# Patient Record
Sex: Male | Born: 2000 | Race: Black or African American | Hispanic: No | Marital: Single | Smoking: Never smoker
Health system: Southern US, Community
[De-identification: ages and names within clinical notes are randomized; demographics above are authoritative.]

## PROBLEM LIST (undated history)

## (undated) DIAGNOSIS — R569 Unspecified convulsions: Secondary | ICD-10-CM

---
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
## 2020-04-18 ENCOUNTER — Emergency Department (HOSPITAL_COMMUNITY): Payer: Medicaid Other

## 2020-04-18 ENCOUNTER — Emergency Department (HOSPITAL_COMMUNITY)
Admission: EM | Admit: 2020-04-18 | Discharge: 2020-04-18 | Disposition: A | Discharge status: Home / Self Care | Payer: Medicaid Other | Attending: Emergency Medicine | Admitting: Emergency Medicine

## 2020-04-18 ENCOUNTER — Other Ambulatory Visit: Payer: Self-pay

## 2020-04-18 ENCOUNTER — Encounter (HOSPITAL_COMMUNITY): Payer: Self-pay | Admitting: *Deleted

## 2020-04-18 DIAGNOSIS — M5442 Lumbago with sciatica, left side: Secondary | ICD-10-CM | POA: Diagnosis not present

## 2020-04-18 DIAGNOSIS — M545 Low back pain: Secondary | ICD-10-CM | POA: Diagnosis not present

## 2020-04-18 DIAGNOSIS — Z79899 Other long term (current) drug therapy: Secondary | ICD-10-CM | POA: Insufficient documentation

## 2020-04-18 DIAGNOSIS — M5137 Other intervertebral disc degeneration, lumbosacral region: Secondary | ICD-10-CM | POA: Insufficient documentation

## 2020-04-18 DIAGNOSIS — M5432 Sciatica, left side: Secondary | ICD-10-CM | POA: Insufficient documentation

## 2020-04-18 DIAGNOSIS — M5136 Other intervertebral disc degeneration, lumbar region: Secondary | ICD-10-CM | POA: Diagnosis not present

## 2020-04-18 MED ORDER — IBUPROFEN 600 MG PO TABS: 600.0000 mg | ORAL_TABLET | Freq: Four times a day (QID) | ORAL | 0 refills | Status: DC | PRN

## 2020-04-18 MED ORDER — METHOCARBAMOL 500 MG PO TABS
500.0000 mg | ORAL_TABLET | Freq: Two times a day (BID) | ORAL | 0 refills | Status: DC
Start: 2020-04-18 — End: 2022-04-22

## 2020-04-18 NOTE — Discharge Instructions (Addendum)
Take your medicines as prescribed.  Avoid lifting,  Bending,  Twisting or any other activity that worsens your pain over the next week.  Apply a heating pad to your lower back for 20 minutes several times daily.  You should get rechecked if your symptoms are not better over the next 5 days,  Or you develop increased pain,  Weakness in your leg(s) or loss of bladder or bowel function - these can be symptoms of a worsened injury.  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  This can be a chronic condition that causes occasional pain, but if you develop persistent pain or symptoms of weakness as mentioned, you will need to see a back specialist.  Discuss this with your primary MD if needed.

## 2020-04-18 NOTE — ED Triage Notes (Signed)
Pain in low back radiating into left leg, injured a year ago playing basketball

## 2020-04-18 NOTE — ED Provider Notes (Signed)
[facility] EMERGENCY DEPARTMENT Provider Note   CSN: [id number] Arrival date & time: 04/18/20  1258     History Chief Complaint  Patient presents with  . Back Pain    Cicero R Nelles is a 19 y.o. male with a history as outlined below, presenting for low back pain with radiation into his left posterior thigh which started yesterday.  He reports having an injury to his back about a year ago as he landed from reaching a rebound while playing basketball with his back twisted.  Since then he has had several episodes of low back pain, but presents today with worsened pain with radiation into his mid left posterior thigh.  This flareup started yesterday.  Pain is worsened with movement and positional changes and is better at rest.  He has had no medications or other treatment prior to arrival.  He denies weakness or numbness in his legs and has had no fevers, denies urinary or fecal incontinence or retention.  Denies IV drug use.  HPI     Past Medical History:  Diagnosis Date  . Seizures [facility])     Patient Active Problem List   Diagnosis Date Noted  . 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 Syncope 09/11/2014  . Convulsion (HCC) 08/20/2014    History reviewed. No pertinent surgical history.     No family history on file.  Social History   Tobacco Use  . Smoking status: Never Smoker  . Smokeless tobacco: Never Used  Substance Use Topics  . Alcohol use: No    Alcohol/week: 0.0 standard drinks  . Drug use: No    Home Medications Prior to Admission medications   Medication Sig Start Date End Date Taking? Authorizing Provider  cetirizine (ZYRTEC) 10 MG tablet Take 1 tablet (10 mg total) by mouth daily. 04/16/17   Rosiland Oz, MD  fluticasone (FLONASE) 50 MCG/ACT nasal spray Place 2 sprays into both nostrils daily. 04/16/17   Rosiland Oz, MD  ibuprofen (ADVIL) 600 MG tablet Take 1 tablet (600 mg total) by mouth every 6 (six) hours as needed.  04/18/20   Burgess Amor, PA-C  methocarbamol (ROBAXIN) 500 MG tablet Take 1 tablet (500 mg total) by mouth 2 (two) times daily. 04/18/20   Burgess Amor, PA-C    Allergies    Patient has no known allergies.  Review of Systems   Review of Systems  Constitutional: Negative for fever.  Respiratory: Negative for shortness of breath.   Cardiovascular: Negative for chest pain and leg swelling.  Gastrointestinal: Negative for abdominal distention, abdominal pain and constipation.  Genitourinary: Negative for difficulty urinating, dysuria, flank pain, frequency and urgency.  Musculoskeletal: Positive for back pain. Negative for gait problem and joint swelling.  Skin: Negative for rash.  Neurological: Negative for weakness and numbness.    Physical Exam Updated Vital Signs BP 112/68   Pulse 68   Temp 98 F (36.7 C)   Resp 18   Ht 5\' 8"  (1.727 m)   Wt 57.6 kg   SpO2 100%   BMI 19.31 kg/m   Physical Exam Vitals and nursing note reviewed.  Constitutional:      Appearance: He is well-developed.  HENT:     Head: Normocephalic.  Eyes:     Conjunctiva/sclera: Conjunctivae normal.  Cardiovascular:     Rate and Rhythm: Normal rate.     Comments: Pedal pulses normal. Pulmonary:     Effort: Pulmonary effort is normal.  Abdominal:     General:  Bowel sounds are normal. There is no distension.     Palpations: Abdomen is soft. There is no mass.  Musculoskeletal:        General: Normal range of motion.     Cervical back: Normal range of motion and neck supple.     Lumbar back: Tenderness present. No swelling, edema or spasms.  Skin:    General: Skin is warm and dry.  Neurological:     Mental Status: He is alert.     Sensory: No sensory deficit.     Motor: No tremor or atrophy.     Gait: Gait normal.     Deep Tendon Reflexes:     Reflex Scores:      Patellar reflexes are 2+ on the right side and 2+ on the left side.      Achilles reflexes are 2+ on the right side and 2+ on the left  side.    Comments: No strength deficit noted in hip and knee flexor and extensor muscle groups.  Ankle flexion and extension intact.     ED Results / Procedures / Treatments   Labs (all labs ordered are listed, but only abnormal results are displayed) Labs Reviewed - No data to display  EKG None  Radiology DG Lumbar Spine Complete  Result Date: 04/18/2020 CLINICAL DATA:  Low back pain with radicular symptoms EXAM: LUMBAR SPINE - COMPLETE 4+ VIEW COMPARISON:  None. FINDINGS: Frontal, lateral, spot lumbosacral lateral, and bilateral oblique views were obtained. There are 5 non-rib-bearing lumbar type vertebral bodies. There is no fracture or spondylolisthesis. There is mild disc space narrowing at L5-S1. Other disc spaces appear normal. There is no appreciable facet arthropathy. IMPRESSION: Mild disc space narrowing at L5-S1. Other discs appear normal. Appreciable facet arthropathy. No fracture or spondylolisthesis. Electronically Signed   By: Bretta Bang III M.D.   On: 04/18/2020 14:50    Procedures Procedures (including critical care time)  Medications Ordered in ED Medications - No data to display  ED Course  I have reviewed the triage vital signs and the nursing notes.  Pertinent labs & imaging results that were available during my care of the patient were reviewed by me and considered in my medical decision making (see chart for details).    MDM Rules/Calculators/A&P                          No neuro deficit on exam or by history to suggest emergent or surgical presentation.  Also discussed worsened sx that should prompt immediate re-evaluation including distal weakness, bowel/bladder retention/incontinence.   Plan f/u with pcp in one week if not improved.  Also discussed he may need back specialist for any persistent or worsened sx which he can discuss with pcp.  Ibuprofen, robaxin, heat tx. No neuro deficits. No equina sx.        Final Clinical Impression(s) / ED  Diagnoses Final diagnoses:  Sciatica of left side  DDD (degenerative disc disease), lumbosacral    Rx / DC Orders ED Discharge Orders         Ordered    ibuprofen (ADVIL) 600 MG tablet  Every 6 hours PRN     Discontinue  Reprint     04/18/20 1502    methocarbamol (ROBAXIN) 500 MG tablet  2 times daily     Discontinue  Reprint     04/18/20 1502           Burgess Amor, PA-C 04/18/20  1547    Terrilee Files, MD 04/18/20 [phone]

## 2020-12-04 IMAGING — DX DG LUMBAR SPINE COMPLETE 4+V
5 series · 5 of 5 positions shown · non-contrast
Comparison: None.

CLINICAL DATA: Low back pain with radicular symptoms

EXAM:
LUMBAR SPINE - COMPLETE 4+ VIEW

[l-spine ap]
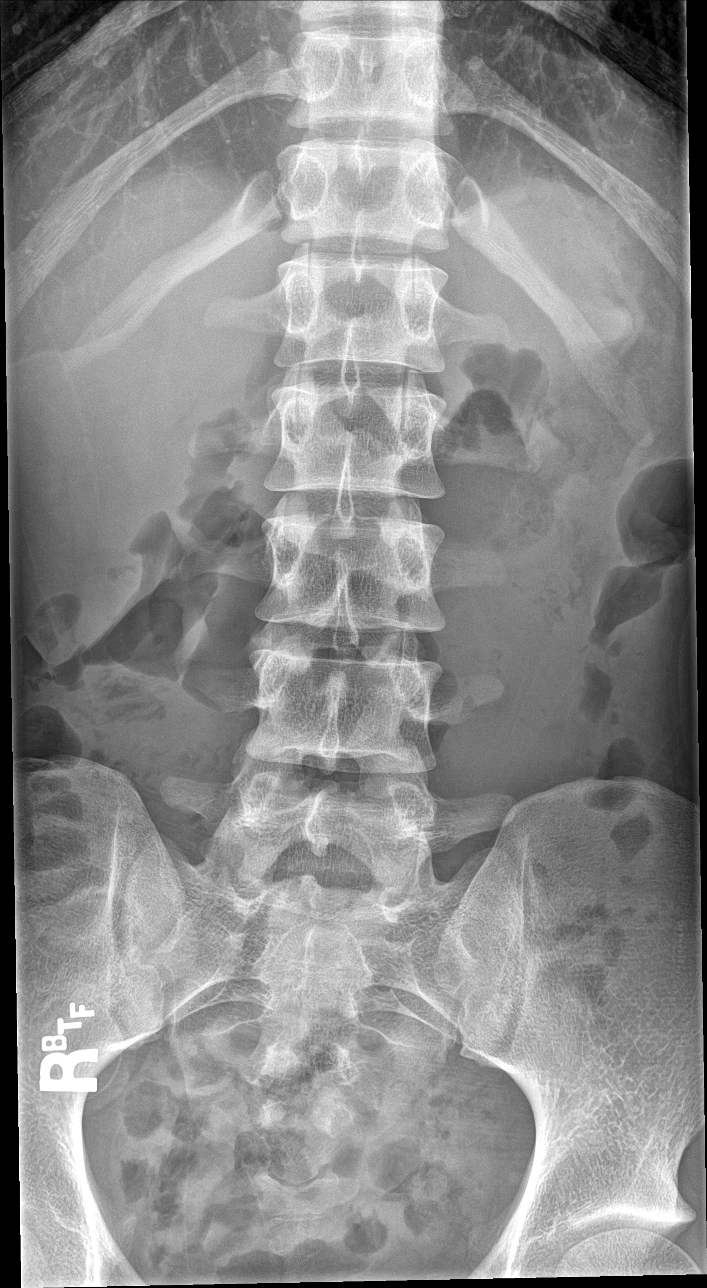

[l-spine obl (1 of 2)]
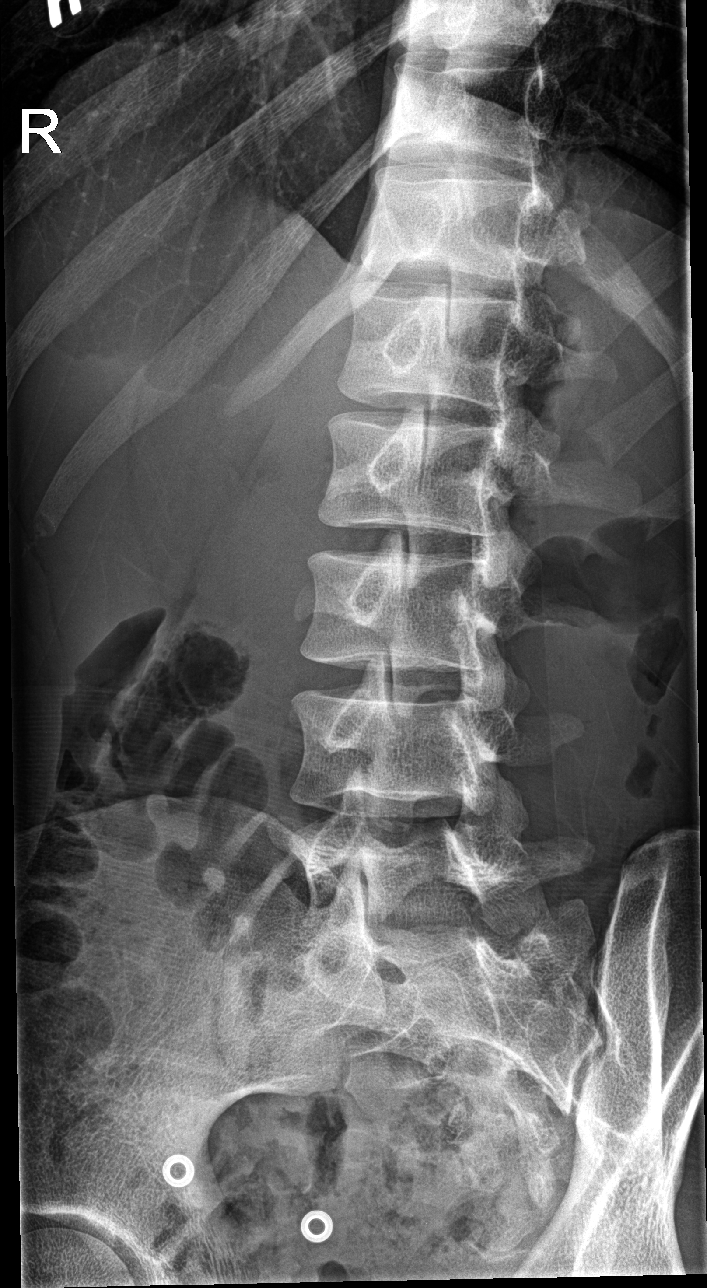

[l-spine obl (2 of 2)]
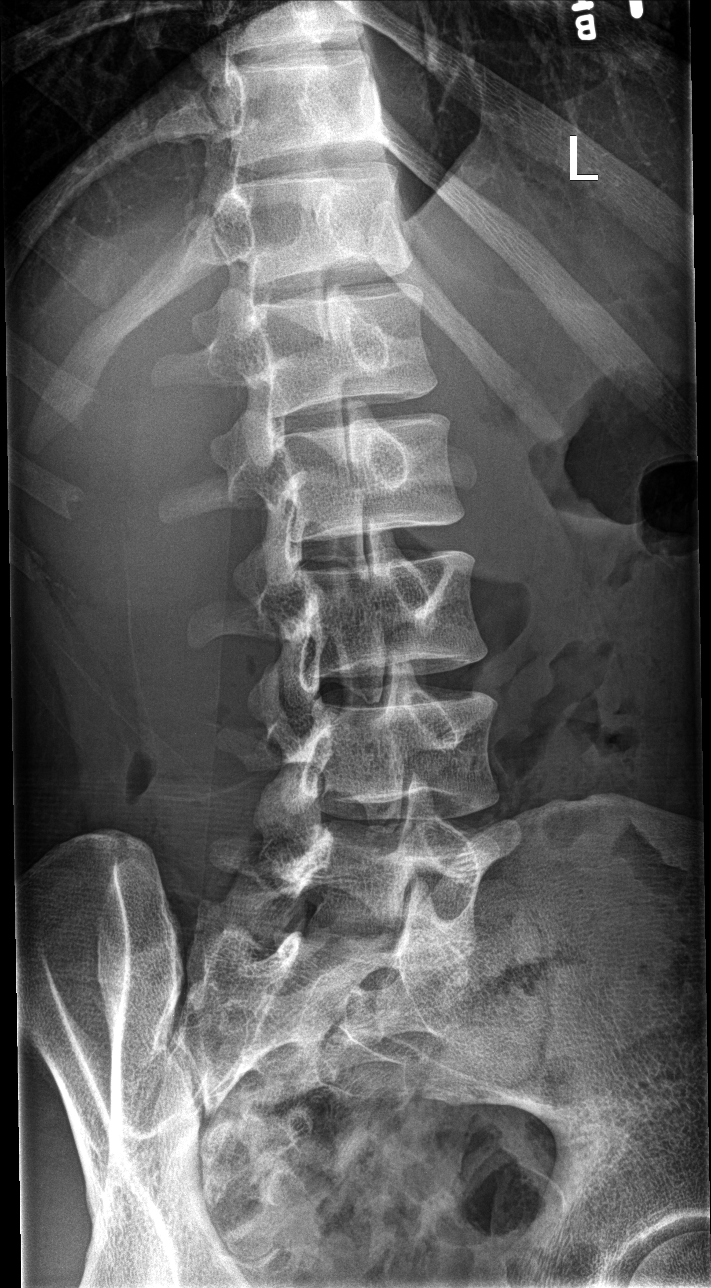

[l-spine lat]
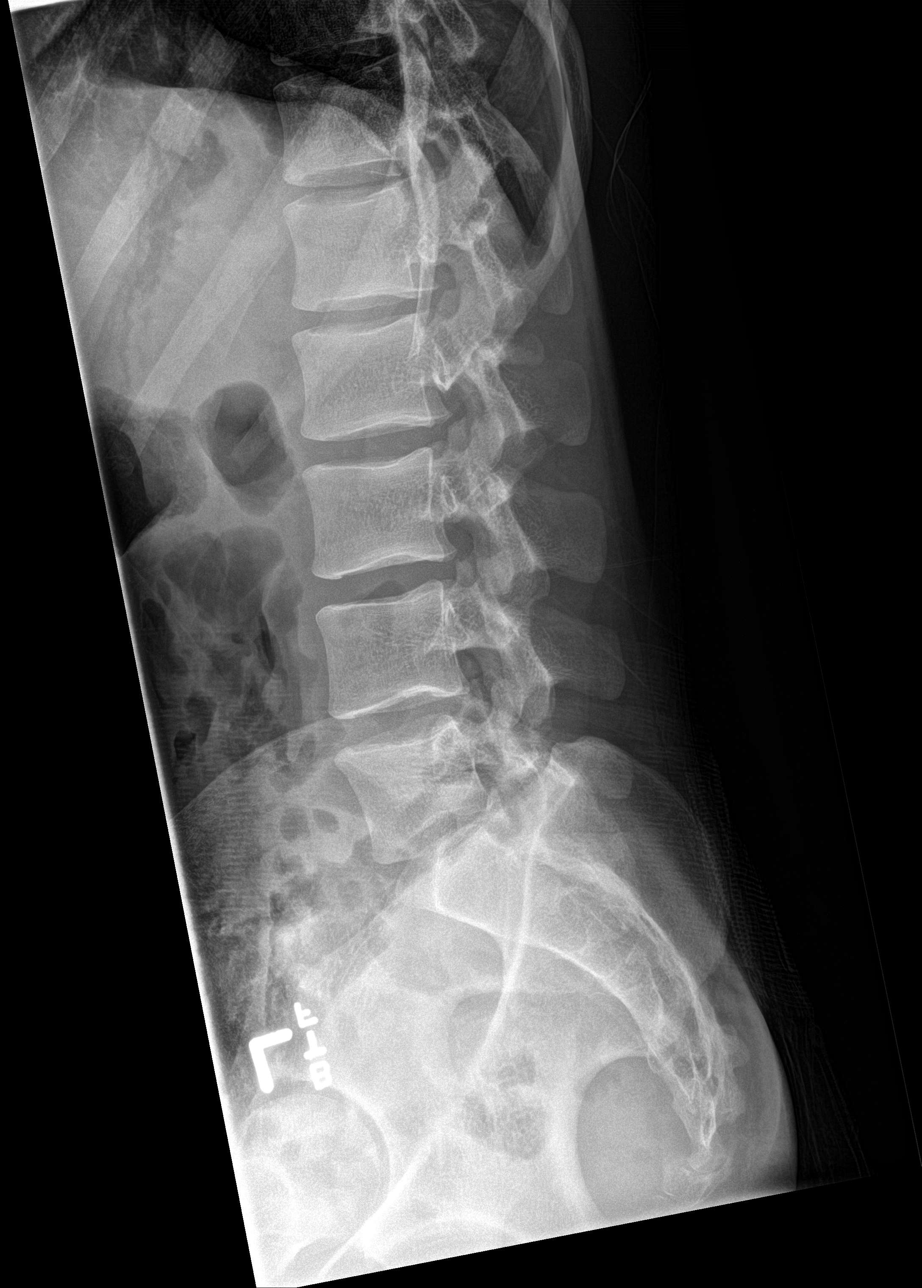

[l-spine spot]
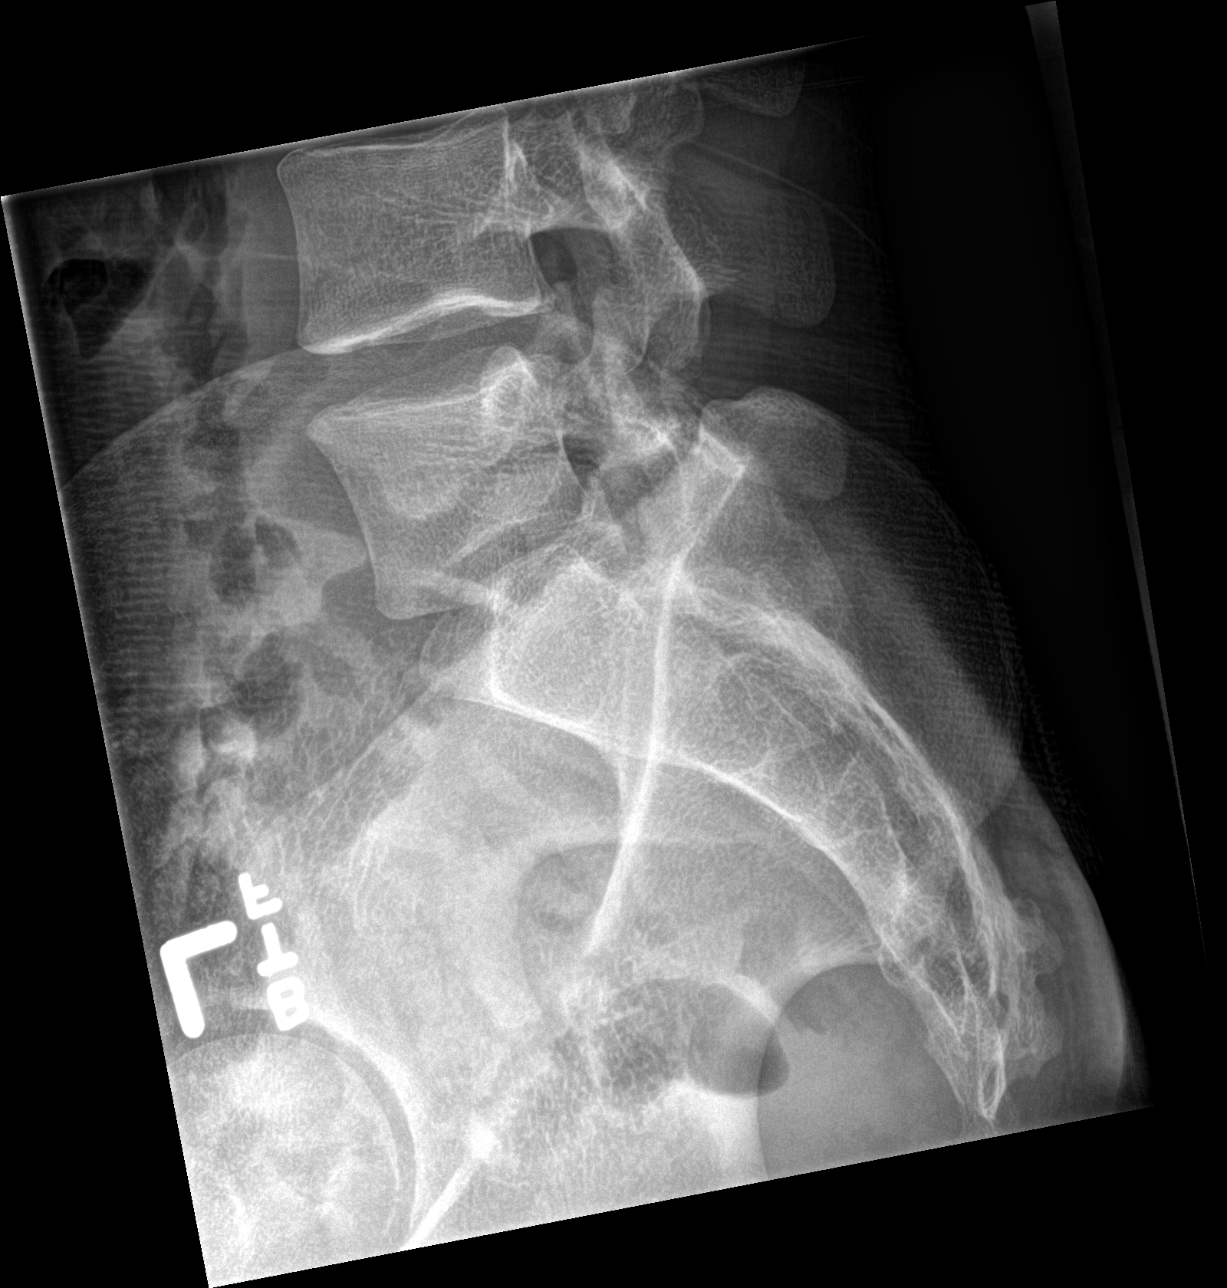

[5 of 5 positions shown; findings below may reference images not displayed]

FINDINGS: Frontal, lateral, spot lumbosacral lateral, and bilateral oblique
views were obtained. There are 5 non-rib-bearing lumbar type
vertebral bodies. There is no fracture or spondylolisthesis. There
is mild disc space narrowing at L5-S1. Other disc spaces appear
normal. There is no appreciable facet arthropathy.
IMPRESSION: Mild disc space narrowing at L5-S1. Other discs appear normal.
Appreciable facet arthropathy. No fracture or spondylolisthesis.

## 2022-04-20 ENCOUNTER — Ambulatory Visit
Admission: EM | Admit: 2022-04-20 | Discharge: 2022-04-20 | Disposition: A | Discharge status: Home / Self Care | Payer: Medicaid Other | Attending: Nurse Practitioner | Admitting: Nurse Practitioner

## 2022-04-20 ENCOUNTER — Ambulatory Visit (INDEPENDENT_AMBULATORY_CARE_PROVIDER_SITE_OTHER): Payer: Medicaid Other

## 2022-04-20 DIAGNOSIS — S92252A Displaced fracture of navicular [scaphoid] of left foot, initial encounter for closed fracture: Secondary | ICD-10-CM | POA: Diagnosis not present

## 2022-04-20 DIAGNOSIS — M79672 Pain in left foot: Secondary | ICD-10-CM

## 2022-04-20 MED ORDER — IBUPROFEN 800 MG PO TABS: 800.0000 mg | ORAL_TABLET | Freq: Three times a day (TID) | ORAL | 0 refills | Status: DC

## 2022-04-20 NOTE — Discharge Instructions (Addendum)
Your x-rays show suspicion of a small fracture of the bone in your left foot. Take medication as prescribed. RICE therapy, rest, ice, compression and elevation. Nonweightbearing until you have been seen by orthopedics. You will need to follow-up with [facility] [place] at [phone] or [facility] in [place] at [phone].  He should follow-up with orthopedics within the next 48 to 72 hours. Follow-up as needed.

## 2022-04-20 NOTE — ED Triage Notes (Signed)
Pt presents with left foot pain from fall last night playing basketball

## 2022-04-20 NOTE — ED Provider Notes (Signed)
[facility]    CSN: [id number] Arrival date & time: 04/20/22  0935      History   Chief Complaint Chief Complaint  Patient presents with   Foot Injury    HPI Tanner Carter is a 21 y.o. male.    Foot Injury   Patient presents for complaints of left foot pain after landing incorrectly playing basketball last evening.  Pain is located to the "outside" of the left foot.  Patient states after the injury, he developed pain, swelling, and inability to bear weight.  He denies numbness, tingling, or radiation of pain.  States that he applied ice to the left foot after the injury.  Denies any previous injury or trauma to the left foot.  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 No pertinent surgical history.     Home Medications    Prior to Admission medications   Medication Sig Start Date End Date Taking? Authorizing Provider  ibuprofen (ADVIL) 800 MG tablet Take 1 tablet (800 mg total) by mouth 3 (three) times daily. 04/20/22  Yes Gagandeep Pettet-Warren, Sadie Haber, NP  cetirizine (ZYRTEC) 10 MG tablet Take 1 tablet (10 mg total) by mouth daily. 04/16/17   Rosiland Oz, MD  fluticasone (FLONASE) 50 MCG/ACT nasal spray Place 2 sprays into both nostrils daily. 04/16/17   Rosiland Oz, MD  methocarbamol (ROBAXIN) 500 MG tablet Take 1 tablet (500 mg total) by mouth 2 (two) times daily. 04/18/20   Burgess Amor, PA-C    Family History History reviewed. No pertinent family history.  Social History Social History   Tobacco Use   Smoking status: Never   Smokeless tobacco: Never  Substance Use Topics   Alcohol use: No    Alcohol/week: 0.0 standard drinks of alcohol   Drug use: No     Allergies   Patient has no known allergies.   Review of Systems Review of  Systems Per HPI  Physical Exam Triage Vital Signs ED Triage Vitals  Enc Vitals Group     BP 04/20/22 0956 117/79     Pulse Rate 04/20/22 0956 90     Resp 04/20/22 0956 18     Temp 04/20/22 0956 98.6 F (37 C)     Temp src --      SpO2 04/20/22 0956 100 %     Weight --      Height --      Head Circumference --      Peak Flow --      Pain Score 04/20/22 0955 7     Pain Loc --      Pain Edu? --      Excl. in GC? --    No data found.  Updated Vital Signs BP 117/79   Pulse 90   Temp 98.6 F (37 C)   Resp 18   SpO2 100%   Visual Acuity Right Eye Distance:   Left Eye Distance:   Bilateral Distance:    Right Eye Near:   Left Eye Near:    Bilateral Near:     Physical Exam Vitals and nursing note reviewed.  Constitutional:      Appearance: Normal appearance.  HENT:     Head: Normocephalic.  Eyes:     Extraocular Movements: Extraocular movements  intact.     Pupils: Pupils are equal, round, and reactive to light.  Pulmonary:     Effort: Pulmonary effort is normal.  Musculoskeletal:     Cervical back: Normal range of motion.     Left foot: Decreased range of motion. Normal capillary refill. Swelling and tenderness present. Normal pulse.  Neurological:     General: No focal deficit present.     Mental Status: He is alert and oriented to person, place, and time.  Psychiatric:        Mood and Affect: Mood normal.        Behavior: Behavior normal.      UC Treatments / Results  Labs (all labs ordered are listed, but only abnormal results are displayed) Labs Reviewed - No data to display  EKG   Radiology DG Foot Complete Left  Result Date: 04/20/2022 CLINICAL DATA:  Lateral foot pain after rolling ankle yesterday while playing basketball. EXAM: LEFT FOOT - COMPLETE 3+ VIEW COMPARISON:  None Available. FINDINGS: There is dorsal soft tissue swelling overlying the talus and navicular bone. A small avulsion fracture is suspected arising off the dorsal aspect of  the navicular bone adjacent to the talonavicular joint. No additional acute osseous findings. IMPRESSION: Suspect small avulsion fracture arising off the dorsal aspect of the navicular bone adjacent to the talonavicular joint. Recommend correlation for any focal tenderness over this area. Electronically Signed   By: Signa Kell M.D.   On: 04/20/2022 10:10    Procedures Procedures (including critical care time)  Medications Ordered in UC Medications - No data to display  Initial Impression / Assessment and Plan / UC Course  I have reviewed the triage vital signs and the nursing notes.  Pertinent labs & imaging results that were available during my care of the patient were reviewed by me and considered in my medical decision making (see chart for details).  Patient presents with left foot pain after playing basketball 1 day ago.  On exam, patient has tenderness to the fifth metatarsal.  X-rays show suspicion for small avulsion fracture of the navicular bone of the left foot.  Short leg posterior and stirrup splint were applied.  Patient was given prescription for ibuprofen for pain management.  Supportive care recommendations were provided to the patient.  Patient was given information to follow-up with [facility] [place] or [facility] in [place] within the next 48 to 72 hours.  Follow-up as needed.  Final Clinical Impressions(s) / UC Diagnoses   Final diagnoses:  Closed avulsion fracture of navicular bone of left foot, initial encounter     Discharge Instructions      Your x-rays show suspicion of a small fracture of the bone in your left foot. Take medication as prescribed. RICE therapy, rest, ice, compression and elevation. Nonweightbearing until you have been seen by orthopedics. You will need to follow-up with [facility] [place] at [phone] or [facility] in [place] at [phone].  He should follow-up with orthopedics within the next 48 to 72 hours. Follow-up  as needed.       ED Prescriptions     Medication Sig Dispense Auth. Provider   ibuprofen (ADVIL) 800 MG tablet Take 1 tablet (800 mg total) by mouth 3 (three) times daily. 30 tablet Vineet Kinney-Warren, Sadie Haber, NP      PDMP not reviewed this encounter.   Abran Cantor, NP 04/20/22 1031

## 2022-04-22 ENCOUNTER — Encounter: Payer: Self-pay | Admitting: Orthopaedic Surgery

## 2022-04-22 ENCOUNTER — Ambulatory Visit (INDEPENDENT_AMBULATORY_CARE_PROVIDER_SITE_OTHER): Payer: Medicaid Other | Admitting: Orthopaedic Surgery

## 2022-04-22 VITALS — BP 116/78 | HR 62

## 2022-04-22 DIAGNOSIS — S92255A Nondisplaced fracture of navicular [scaphoid] of left foot, initial encounter for closed fracture: Secondary | ICD-10-CM | POA: Diagnosis not present

## 2022-04-22 DIAGNOSIS — S92355A Nondisplaced fracture of fifth metatarsal bone, left foot, initial encounter for closed fracture: Secondary | ICD-10-CM | POA: Diagnosis not present

## 2022-04-22 MED ORDER — HYDROCODONE-ACETAMINOPHEN 5-325 MG PO TABS: ORAL_TABLET | ORAL | 0 refills | Status: AC

## 2022-04-22 NOTE — Patient Instructions (Signed)
OOW NOTE Do contrast baths Wear boot when up on foot

## 2022-04-22 NOTE — Progress Notes (Signed)
Subjective:    Patient ID: Tanner Carter, male    DOB: [birth year], 21 y.o.   MRN: [id number]  HPI He hurt his left foot playing basketball with his brother on 04-19-22.  He came down from layup and twisted his left foot.  He has pain dorsally and laterally.  He was seen at the Urgent Care on 04-20-22.  X-rays were done and showed: IMPRESSION: Suspect small avulsion fracture arising off the dorsal aspect of the navicular bone adjacent to the talonavicular joint. Recommend correlation for any focal tenderness over this area.  I have reviewed the Urgent Care Notes.  I have independently reviewed and interpreted x-rays of this patient done at another site by another physician or qualified health professional.  He was given crutches and Ace bandage.  He has pain of the foot and cannot do the walking he is required to do at work at [employer].  He has no other injury.  Review of Systems  Constitutional:  Positive for activity change.  Musculoskeletal:  Positive for arthralgias, gait problem and joint swelling.  All other systems reviewed and are negative. For Review of Systems, all other systems reviewed and are negative.  The following is a summary of the past history medically, past history surgically, known current medicines, social history and family history.  This information is gathered electronically by the computer from prior information and documentation.  I review this each visit and have found including this information at this point in the chart is beneficial and informative.   Past Medical History:  Diagnosis Date   Seizures (HCC)     History reviewed. No pertinent surgical history.  No current outpatient medications on file prior to visit.   No current facility-administered medications on file prior to visit.    Social History   Socioeconomic History   Marital status: Single    Spouse name: Not on file   Number of children: Not on file   Years of education: Not on  file   Highest education level: Not on file  Occupational History   Not on file  Tobacco Use   Smoking status: Never   Smokeless tobacco: Never  Substance and Sexual Activity   Alcohol use: No    Alcohol/week: 0.0 standard drinks of alcohol   Drug use: No   Sexual activity: Never  Other Topics Concern   Not on file  Social History Narrative   Lives with mother, grandmother, grandfather, brother, sisters      [employer] 11th grade    Social Determinants of Health   Financial Resource Strain: Not on file  Food Insecurity: Not on file  Transportation Needs: Not on file  Physical Activity: Not on file  Stress: Not on file  Social Connections: Not on file  Intimate Partner Violence: Not on file    History reviewed. No pertinent family history.  BP 116/78   Pulse 62   There is no height or weight on file to calculate BMI.      Objective:   Physical Exam Vitals and nursing note reviewed. Exam conducted with a chaperone present.  Constitutional:      Appearance: He is well-developed.  HENT:     Head: Normocephalic and atraumatic.  Eyes:     Conjunctiva/sclera: Conjunctivae normal.     Pupils: Pupils are equal, round, and reactive to light.  Cardiovascular:     Rate and Rhythm: Normal rate and regular rhythm.  Pulmonary:     Effort: Pulmonary effort is  normal.  Abdominal:     Palpations: Abdomen is soft.  Musculoskeletal:     Cervical back: Normal range of motion and neck supple.       Feet:  Skin:    General: Skin is warm and dry.  Neurological:     Mental Status: He is alert and oriented to person, place, and time.     Cranial Nerves: No cranial nerve deficit.     Motor: No abnormal muscle tone.     Coordination: Coordination normal.     Deep Tendon Reflexes: Reflexes are normal and symmetric. Reflexes normal.  Psychiatric:        Behavior: Behavior normal.        Thought Content: Thought content normal.        Judgment: Judgment normal.            Assessment & Plan:   Encounter Diagnosis  Name Primary?   Closed traumatic nondisplaced fracture of navicular bone of left foot, initial encounter Yes   I have given CAM walker.  I have given instructions for Contrast Baths.  I have reviewed the [place] Controlled Substance Reporting System web site prior to prescribing narcotic medicine for this patient.  Return in two weeks.  Note to be out of work given.  He can return when he feels he is able.  Call if any problem.  Precautions discussed.  X-rays left foot on return.  Electronically Signed Darreld Mclean, MD 7/19/202310:58 AM

## 2022-05-06 ENCOUNTER — Ambulatory Visit (INDEPENDENT_AMBULATORY_CARE_PROVIDER_SITE_OTHER): Payer: Medicaid Other | Admitting: Orthopaedic Surgery

## 2022-05-06 ENCOUNTER — Ambulatory Visit (INDEPENDENT_AMBULATORY_CARE_PROVIDER_SITE_OTHER): Payer: Medicaid Other

## 2022-05-06 ENCOUNTER — Encounter: Payer: Self-pay | Admitting: Orthopaedic Surgery

## 2022-05-06 DIAGNOSIS — S92255A Nondisplaced fracture of navicular [scaphoid] of left foot, initial encounter for closed fracture: Secondary | ICD-10-CM | POA: Diagnosis not present

## 2022-05-06 DIAGNOSIS — S92255D Nondisplaced fracture of navicular [scaphoid] of left foot, subsequent encounter for fracture with routine healing: Secondary | ICD-10-CM

## 2022-05-06 NOTE — Progress Notes (Signed)
It feels much better.  He has little pain of the left foot now.  He has been using the CAM walker.  ROM of the left foot is full.  He has slight tenderness over the navicular area dorsally.  NV intact.  X-rays were done of the left foot, reported separately.  Encounter Diagnosis  Name Primary?   Closed traumatic nondisplaced fracture of navicular bone of left foot with routine healing, subsequent encounter Yes   He can come out of the CAM walker.  Return in one month.  X-rays then of left foot.  Call if any problem.  Precautions discussed.  Electronically Signed Darreld Mclean, MD 8/2/20238:34 AM

## 2022-05-06 NOTE — Patient Instructions (Signed)
1 mth follow up w/ xray left foot

## 2022-06-03 ENCOUNTER — Ambulatory Visit (INDEPENDENT_AMBULATORY_CARE_PROVIDER_SITE_OTHER): Payer: Medicaid Other

## 2022-06-03 ENCOUNTER — Ambulatory Visit (INDEPENDENT_AMBULATORY_CARE_PROVIDER_SITE_OTHER): Payer: Medicaid Other | Admitting: Orthopaedic Surgery

## 2022-06-03 ENCOUNTER — Encounter: Payer: Self-pay | Admitting: Orthopaedic Surgery

## 2022-06-03 DIAGNOSIS — S92255D Nondisplaced fracture of navicular [scaphoid] of left foot, subsequent encounter for fracture with routine healing: Secondary | ICD-10-CM

## 2022-06-03 NOTE — Progress Notes (Signed)
I am doing OK.  He has no pain with his left foot.  He is doing well.  He has normal gait, no pain of the left foot, no swelling.  NV intact.  X-rays were done of the left foot, reported separately.  Encounter Diagnosis  Name Primary?   Closed traumatic nondisplaced fracture of navicular bone of left foot with routine healing, subsequent encounter Yes   I will see him as needed.  He needs notes for his work.  Call if any problem.  Precautions discussed.  Electronically Signed Darreld Mclean, MD 8/30/20239:01 AM

## 2022-06-03 NOTE — Patient Instructions (Signed)
Give patient an OOW note for the dates he tells you, per Dr.Keeling

## 2022-12-03 ENCOUNTER — Encounter: Payer: Self-pay | Admitting: Radiology

## 2024-05-26 ENCOUNTER — Encounter: Payer: Self-pay | Admitting: Radiology

## 2024-08-07 ENCOUNTER — Encounter: Payer: Self-pay | Admitting: Radiology
